# Patient Record
Sex: Male | Born: 1940 | Hispanic: No | Marital: Married | Smoking: Former smoker
Health system: Southern US, Community
[De-identification: ages and names within clinical notes are randomized; demographics above are authoritative.]

## PROBLEM LIST (undated history)

## (undated) DIAGNOSIS — R972 Elevated prostate specific antigen [PSA]: Secondary | ICD-10-CM

## (undated) DIAGNOSIS — H409 Unspecified glaucoma: Secondary | ICD-10-CM

## (undated) DIAGNOSIS — Z9889 Other specified postprocedural states: Secondary | ICD-10-CM

## (undated) DIAGNOSIS — K4090 Unilateral inguinal hernia, without obstruction or gangrene, not specified as recurrent: Secondary | ICD-10-CM

## (undated) DIAGNOSIS — D369 Benign neoplasm, unspecified site: Secondary | ICD-10-CM

## (undated) HISTORY — DX: Elevated prostate specific antigen (PSA): R97.20

## (undated) HISTORY — DX: Benign neoplasm, unspecified site: D36.9

## (undated) HISTORY — DX: Unilateral inguinal hernia, without obstruction or gangrene, not specified as recurrent: K40.90

## (undated) HISTORY — DX: Other specified postprocedural states: Z98.890

## (undated) HISTORY — DX: Unspecified glaucoma: H40.9

## (undated) HISTORY — PX: PROSTATE SURGERY: SHX751

## (undated) HISTORY — PX: VASECTOMY: SHX75

## (undated) HISTORY — PX: TONSILLECTOMY: SUR1361

---

## 1999-07-22 ENCOUNTER — Other Ambulatory Visit: Admission: RE | Admit: 1999-07-22 | Discharge: 1999-07-22 | Payer: Self-pay | Admitting: Urology

## 2000-10-04 ENCOUNTER — Other Ambulatory Visit: Admission: RE | Admit: 2000-10-04 | Discharge: 2000-10-04 | Payer: Self-pay | Admitting: Urology

## 2000-10-04 ENCOUNTER — Encounter (INDEPENDENT_AMBULATORY_CARE_PROVIDER_SITE_OTHER): Payer: Self-pay | Admitting: Specialist

## 2001-06-15 ENCOUNTER — Encounter (INDEPENDENT_AMBULATORY_CARE_PROVIDER_SITE_OTHER): Payer: Self-pay | Admitting: *Deleted

## 2001-06-15 ENCOUNTER — Other Ambulatory Visit: Admission: RE | Admit: 2001-06-15 | Discharge: 2001-06-15 | Payer: Self-pay | Admitting: Urology

## 2003-09-06 HISTORY — PX: EYE SURGERY: SHX253

## 2003-09-19 ENCOUNTER — Ambulatory Visit (HOSPITAL_COMMUNITY): Admission: RE | Admit: 2003-09-19 | Discharge: 2003-09-19 | Payer: Self-pay | Admitting: Gastroenterology

## 2003-09-19 ENCOUNTER — Encounter (INDEPENDENT_AMBULATORY_CARE_PROVIDER_SITE_OTHER): Payer: Self-pay | Admitting: Specialist

## 2007-08-15 ENCOUNTER — Encounter (INDEPENDENT_AMBULATORY_CARE_PROVIDER_SITE_OTHER): Payer: Self-pay | Admitting: Urology

## 2007-08-15 ENCOUNTER — Ambulatory Visit (HOSPITAL_BASED_OUTPATIENT_CLINIC_OR_DEPARTMENT_OTHER): Admission: RE | Admit: 2007-08-15 | Discharge: 2007-08-15 | Payer: Self-pay | Admitting: Urology

## 2007-12-22 ENCOUNTER — Emergency Department (HOSPITAL_COMMUNITY): Admission: EM | Admit: 2007-12-22 | Discharge: 2007-12-22 | Payer: Self-pay | Admitting: Emergency Medicine

## 2011-01-18 NOTE — Op Note (Signed)
NAMEAMIRR, Lance Cruz               ACCOUNT NO.:  [id number]   MEDICAL RECORD NO.:  [id number]          PATIENT TYPE:  AMB   LOCATION:  [facility]                         FACILITY:  [facility]   PHYSICIAN:  Courtney Paris, M.D.DATE OF BIRTH:  1940/12/02   DATE OF PROCEDURE:  08/15/2007  DATE OF DISCHARGE:                               OPERATIVE REPORT   PREOPERATIVE DIAGNOSIS:  Elevated PSA with three negative biopsies.   POSTOPERATIVE DIAGNOSIS:  Elevated PSA with three negative biopsies.   OPERATION:  Transrectal ultrasound of the prostate and saturation  prostate biopsy.   ANESTHESIA:  General.   SURGEON:  Courtney Paris, M.D.   BRIEF HISTORY:  This 70 year old white male was admitted for saturation  biopsy of his prostate.  He has had an elevated PSA now 10.4.  Four  years ago, it was 6.0. He has had three previous biopsies in 2000 and  2002 in February and also on October. He has obstructive uropathy under  conservative management and enters now for repeat biopsy of the prostate  under anesthesia since the last was so painful.   He had been on antibiotics prior to the procedure, he was given Rocephin  IV and underwent successful induction of general anesthesia. He was  placed on his left lateral side after endotracheal intubation.  The 7.5  MHz prostate probe was inserted and the prostate scanned. The prostate  measured 44 grams in size.  It had some scattered calcifications typical  of BPH but no definite hypoechoic areas. The right seminal vesicle was  slightly larger than the left.  There was a vague hypoechoic area on the  left base.  I then took two biopsies of the left base and three of the  right base, two each of the left and right mid and then three each of  the left and right apex. These were sent in six separate containers  describing the area where the prostate tissue was assembled.  He had a  little bit of urethral and rectal bleeding that seemed to stop.  He  was  taken to the recovery room in good condition to be later discharged as  an outpatient. He will continue antibiotics for 5 days postop and be  called regarding his biopsy report when available.      Courtney Paris, M.D.  Electronically Signed     HMK/MEDQ  D:  08/15/2007  T:  08/15/2007  Job:  [id number]

## 2011-01-21 NOTE — Op Note (Signed)
NAME:  Lance Cruz, Lance Cruz                         ACCOUNT NO.:  [id number]   MEDICAL RECORD NO.:  [id number]                   PATIENT TYPE:  AMB   LOCATION:  ENDO                                 FACILITY:  [facility]   PHYSICIAN:  John C. Madilyn Fireman, M.D.                 DATE OF BIRTH:  29-Dec-1940   DATE OF PROCEDURE:  09/19/2003  DATE OF DISCHARGE:                                 OPERATIVE REPORT   PROCEDURE:  Colonoscopy.   INDICATIONS FOR PROCEDURE:  Average risk colon cancer screening.   DESCRIPTION OF PROCEDURE:  The patient was placed in the left lateral  decubitus position and placed on the pulse monitor with continuous low-flow  oxygen delivered by nasal cannula.  He was sedated with 75 mcg IV fentanyl  and 8 mg IV Versed.  The Olympus video colonoscope was inserted into the  rectum and advanced to the cecum, confirmed by transillumination of  McBurney's point and visualization of the ileocecal valve and appendiceal  orifice.  Prep was excellent.  The cecum, ascending, transverse, and  descending colon all appeared normal with no masses, polyps, diverticula, or  other mucosal abnormalities.  Within the sigmoid colon, there was an 8 mm  polyp that was fulgurated by hot biopsy.  In the rectum, there was a similar  polyp, 6 mm in diameter that was also fulgurated by hot biopsy.  No other  abnormalities were seen in the sigmoid and rectum.  The colonoscope was then  withdrawn and the patient returned to the recovery room in stable condition.  He tolerated the procedure well and there were no immediate complications.   IMPRESSION:  Sigmoid and rectal polyps.   PLAN:  Will await histology to determine method and interval for future  colon screening.                                               John C. Madilyn Fireman, M.D.    JCH/MEDQ  D:  09/19/2003  T:  09/19/2003  Job:  [id number]   cc:   Caryn Bee L. Little, M.D.  [address]  [place]  [place] [address]  Fax: [phone]

## 2012-10-11 ENCOUNTER — Encounter (INDEPENDENT_AMBULATORY_CARE_PROVIDER_SITE_OTHER): Payer: Self-pay | Admitting: Surgery

## 2012-10-15 ENCOUNTER — Ambulatory Visit (INDEPENDENT_AMBULATORY_CARE_PROVIDER_SITE_OTHER): Payer: 59 | Admitting: Surgery

## 2012-10-17 ENCOUNTER — Ambulatory Visit (INDEPENDENT_AMBULATORY_CARE_PROVIDER_SITE_OTHER): Payer: 59 | Admitting: Surgery

## 2012-10-17 ENCOUNTER — Encounter (INDEPENDENT_AMBULATORY_CARE_PROVIDER_SITE_OTHER): Payer: Self-pay | Admitting: Surgery

## 2012-10-17 VITALS — BP 124/76 | HR 80 | Temp 97.1°F | Resp 16 | Ht 67.5 in | Wt 153.8 lb

## 2012-10-17 DIAGNOSIS — K4020 Bilateral inguinal hernia, without obstruction or gangrene, not specified as recurrent: Secondary | ICD-10-CM | POA: Insufficient documentation

## 2012-10-17 DIAGNOSIS — K4090 Unilateral inguinal hernia, without obstruction or gangrene, not specified as recurrent: Secondary | ICD-10-CM

## 2012-10-17 HISTORY — DX: Unilateral inguinal hernia, without obstruction or gangrene, not specified as recurrent: K40.90

## 2012-10-17 NOTE — Patient Instructions (Signed)
See the Handout(s) we gave you.  Consider surgery.  Please call our office at [phone] if you wish to schedule surgery or if you have further questions / concerns.   Hernia A hernia occurs when an internal organ pushes out through a weak spot in the abdominal wall. Hernias most commonly occur in the groin and around the navel. Hernias often can be pushed back into place (reduced). Most hernias tend to get worse over time. Some abdominal hernias can get stuck in the opening (irreducible or incarcerated hernia) and cannot be reduced. An irreducible abdominal hernia which is tightly squeezed into the opening is at risk for impaired blood supply (strangulated hernia). A strangulated hernia is a medical emergency. Because of the risk for an irreducible or strangulated hernia, surgery may be recommended to repair a hernia. CAUSES   Heavy lifting.  Prolonged coughing.  Straining to have a bowel movement.  A cut (incision) made during an abdominal surgery. HOME CARE INSTRUCTIONS   Bed rest is not required. You may continue your normal activities.  Avoid lifting more than 10 pounds (4.5 kg) or straining.  Cough gently. If you are a smoker it is best to stop. Even the best hernia repair can break down with the continual strain of coughing. Even if you do not have your hernia repaired, a cough will continue to aggravate the problem.  Do not wear anything tight over your hernia. Do not try to keep it in with an outside bandage or truss. These can damage abdominal contents if they are trapped within the hernia sac.  Eat a normal diet.  Avoid constipation. Straining over long periods of time will increase hernia size and encourage breakdown of repairs. If you cannot do this with diet alone, stool softeners may be used. SEEK IMMEDIATE MEDICAL CARE IF:   You have a fever.  You develop increasing abdominal pain.  You feel nauseous or vomit.  Your hernia is stuck outside the abdomen, looks  discolored, feels hard, or is tender.  You have any changes in your bowel habits or in the hernia that are unusual for you.  You have increased pain or swelling around the hernia.  You cannot push the hernia back in place by applying gentle pressure while lying down. MAKE SURE YOU:   Understand these instructions.  Will watch your condition.  Will get help right away if you are not doing well or get worse. Document Released: 08/22/2005 Document Revised: 11/14/2011 Document Reviewed: 04/10/2008 [facility] Patient Information 2013 [place], [place].  LAPAROSCOPIC SURGERY: POST OP INSTRUCTIONS  1. DIET: Follow a light bland diet the first 24 hours after arrival home, such as soup, liquids, crackers, etc.  Be sure to include lots of fluids daily.  Avoid fast food or heavy meals as your are more likely to get nauseated.  Eat a low fat the next few days after surgery.   2. Take your usually prescribed home medications unless otherwise directed. 3. PAIN CONTROL: a. Pain is best controlled by a usual combination of three different methods TOGETHER: i. Ice/Heat ii. Over the counter pain medication iii. Prescription pain medication b. Most patients will experience some swelling and bruising around the incisions.  Ice packs or heating pads (30-60 minutes up to 6 times a day) will help. Use ice for the first few days to help decrease swelling and bruising, then switch to heat to help relax tight/sore spots and speed recovery.  Some people prefer to use ice alone, heat alone, alternating  between ice & heat.  Experiment to what works for you.  Swelling and bruising can take several weeks to resolve.   c. It is helpful to take an over-the-counter pain medication regularly for the first few weeks.  Choose one of the following that works best for you: i. Naproxen (Aleve, etc)  Two 220mg  tabs twice a day ii. Ibuprofen (Advil, etc) Three 200mg  tabs four times a day (every meal & bedtime) iii. Acetaminophen  (Tylenol, etc) 500-650mg  four times a day (every meal & bedtime) d. A  prescription for pain medication (such as oxycodone, hydrocodone, etc) should be given to you upon discharge.  Take your pain medication as prescribed.  i. If you are having problems/concerns with the prescription medicine (does not control pain, nausea, vomiting, rash, itching, etc), please call us [phone] to see if we need to switch you to a different pain medicine that will work better for you and/or control your side effect better. ii. If you need a refill on your pain medication, please contact your pharmacy.  They will contact our office to request authorization. Prescriptions will not be filled after 5 pm or on week-ends. 4. Avoid getting constipated.  Between the surgery and the pain medications, it is common to experience some constipation.  Increasing fluid intake and taking a fiber supplement (such as Metamucil, Citrucel, FiberCon, MiraLax, etc) 1-2 times a day regularly will usually help prevent this problem from occurring.  A mild laxative (prune juice, Milk of Magnesia, MiraLax, etc) should be taken according to package directions if there are no bowel movements after 48 hours.   5. Watch out for diarrhea.  If you have many loose bowel movements, simplify your diet to bland foods & liquids for a few days.  Stop any stool softeners and decrease your fiber supplement.  Switching to mild anti-diarrheal medications (Kayopectate, Pepto Bismol) can help.  If this worsens or does not improve, please call us. 6. Wash / shower every day.  You may shower over the dressings as they are waterproof.  Continue to shower over incision(s) after the dressing is off. 7. Remove your waterproof bandages 5 days after surgery.  You may leave the incision open to air.  You may replace a dressing/Band-Aid to cover the incision for comfort if you wish.  8. ACTIVITIES as tolerated:   a. You may resume regular (light) daily activities  beginning the next day-such as daily self-care, walking, climbing stairs-gradually increasing activities as tolerated.  If you can walk 30 minutes without difficulty, it is safe to try more intense activity such as jogging, treadmill, bicycling, low-impact aerobics, swimming, etc. b. Save the most intensive and strenuous activity for last such as sit-ups, heavy lifting, contact sports, etc  Refrain from any heavy lifting or straining until you are off narcotics for pain control.   c. DO NOT PUSH THROUGH PAIN.  Let pain be your guide: If it hurts to do something, don't do it.  Pain is your body warning you to avoid that activity for another week until the pain goes down. d. You may drive when you are no longer taking prescription pain medication, you can comfortably wear a seatbelt, and you can safely maneuver your car and apply brakes. e. Bonita Quin may have sexual intercourse when it is comfortable.  9. FOLLOW UP in our office a. Please call [facility] at [phone] to set up an appointment to see your surgeon in the office for a follow-up appointment approximately 2-3 weeks after  your surgery. b. Make sure that you call for this appointment the day you arrive home to insure a convenient appointment time. 10. IF YOU HAVE DISABILITY OR FAMILY LEAVE FORMS, BRING THEM TO THE OFFICE FOR PROCESSING.  DO NOT GIVE THEM TO YOUR DOCTOR.   WHEN TO CALL us [phone]: 1. Poor pain control 2. Reactions / problems with new medications (rash/itching, nausea, etc)  3. Fever over 101.5 F (38.5 C) 4. Inability to urinate 5. Nausea and/or vomiting 6. Worsening swelling or bruising 7. Continued bleeding from incision. 8. Increased pain, redness, or drainage from the incision   The clinic staff is available to answer your questions during regular business hours (8:30am-5pm).  Please don't hesitate to call and ask to speak to one of our nurses for clinical concerns.   If you have a medical emergency, go to the nearest  emergency room or call 911.  A surgeon from [facility] is always on call at the [facility], [place] [address], [address], [place], [place]  [address] ? MAIN: [phone] ? TOLL FREE: [phone] ?  FAX [phone] [email or web address]

## 2012-10-17 NOTE — Progress Notes (Signed)
Subjective:     Patient ID: Lance Cruz, male   DOB: 1940/11/03, 72 y.o.   MRN: [id number]  HPI  LIEUTENANT ABARCA  1941-07-20 [id number]  Patient Care Team: Catha Gosselin, MD as PCP - General (Family Medicine)  This patient is a 72 y.o.male who presents today for surgical evaluation at the request of Dr. Clarene Duke.   Reason for visit: RIH  Pleasant active male.  Claims he can walk to [employer] without any problems.  Works at [employer]. Regularly.  He lives in [place] but works up in [place].  Noted some right groin bulging and discomfort about two weeks ago after removing.  Is sensitive.  Worse with activity.  Has some sensitivity that goes to the left side as well.  Has daily bowel movements.  No prior surgeries.  No history of skin infections.  Rather physically active.  No episodes of severe incarceration or strangulation.  Parents both alive and active.  Patient Active Problem List  Diagnosis  . Inguinal hernia, right  . Inguinal sensitivity, ? left inguinal hernia    Past Medical History  Diagnosis Date  . Glaucoma   . Tubular adenoma   . PSA elevation   . H/O colonoscopy 09/19/2003 & 04/30/2010    Past Surgical History  Procedure Laterality Date  . Eye surgery  2005    left eye  . Prostate surgery    . Tonsillectomy    . Vasectomy      History   Social History  . Marital Status: Married    Spouse Name: N/A    Number of Children: N/A  . Years of Education: N/A   Occupational History  . Not on file.   Social History Main Topics  . Smoking status: Former Smoker    Quit date: 10/12/1963  . Smokeless tobacco: Not on file  . Alcohol Use: Yes     Comment: 2-3  . Drug Use: No  . Sexually Active: Not on file   Other Topics Concern  . Not on file   Social History Narrative  . No narrative on file    History reviewed. No pertinent family history.  Current Outpatient Prescriptions  Medication Sig Dispense Refill  . aspirin 81 MG tablet Take 81 mg by  mouth daily.      Marland Kitchen glucosamine-chondroitin 500-400 MG tablet Take 1 tablet by mouth 3 (three) times daily.      . Magnesium 250 MG TABS Take by mouth.      . Multiple Vitamin (MULTIVITAMIN) capsule Take 1 capsule by mouth daily.      . Travoprost, BAK Free, (TRAVATAN) 0.004 % SOLN ophthalmic solution 1 drop at bedtime.       No current facility-administered medications for this visit.     No Known Allergies  BP 124/76  Pulse 80  Temp(Src) 97.1 F (36.2 C) (Temporal)  Resp 16  Ht 5' 7.5" (1.715 m)  Wt 153 lb 12.8 oz (69.763 kg)  BMI 23.72 kg/m2  No results found.   Review of Systems  Constitutional: Negative for fever, chills and diaphoresis.  HENT: Negative for nosebleeds, sore throat, facial swelling, mouth sores, trouble swallowing and ear discharge.   Eyes: Negative for photophobia, discharge and visual disturbance.  Respiratory: Negative for choking, chest tightness, shortness of breath and stridor.   Cardiovascular: Negative for chest pain and palpitations.  Gastrointestinal: Negative for nausea, vomiting, diarrhea, constipation, blood in stool, abdominal distention, anal bleeding and rectal pain.  Genitourinary: Negative for dysuria,  urgency, difficulty urinating and testicular pain.  Musculoskeletal: Negative for myalgias, back pain, arthralgias and gait problem.  Skin: Negative for color change, pallor, rash and wound.  Neurological: Negative for dizziness, speech difficulty, weakness, numbness and headaches.  Hematological: Negative for adenopathy. Does not bruise/bleed easily.  Psychiatric/Behavioral: Negative for hallucinations, confusion and agitation.       Objective:   Physical Exam  Constitutional: He is oriented to person, place, and time. He appears well-developed and well-nourished. No distress.  HENT:  Head: Normocephalic.  Mouth/Throat: Oropharynx is clear and moist. No oropharyngeal exudate.  Eyes: Conjunctivae and EOM are normal. Pupils are equal,  round, and reactive to light. No scleral icterus.  Neck: Normal range of motion. Neck supple. No tracheal deviation present.  Cardiovascular: Normal rate, regular rhythm and intact distal pulses.   Pulmonary/Chest: Effort normal and breath sounds normal. No respiratory distress.  Abdominal: Soft. He exhibits no distension. There is no rigidity, no guarding, no CVA tenderness, no tenderness at McBurney's point and negative Murphy's sign. A hernia is present. Hernia confirmed positive in the right inguinal area. Hernia confirmed negative in the ventral area.    Musculoskeletal: Normal range of motion. He exhibits no tenderness.  Lymphadenopathy:    He has no cervical adenopathy.       Right: No inguinal adenopathy present.       Left: No inguinal adenopathy present.  Neurological: He is alert and oriented to person, place, and time. No cranial nerve deficit. He exhibits normal muscle tone. Coordination normal.  Skin: Skin is warm and dry. No rash noted. He is not diaphoretic. No erythema. No pallor.  Psychiatric: He has a normal mood and affect. His behavior is normal. Judgment and thought content normal.       Assessment:     Obvious right inguinal hernia.  Sensitivity and bulge on Valsalva suspicious for left inguinal hernia as well     Plan:     I think he would benefit from surgery to repair his hernia on the right.  Also look on the left side to make sure one is not there as well.  He would like side repairing both sides if there is any evidence that he is sometimes sensitive on the left side as well.  I discussed the procedure with him:  The anatomy & physiology of the abdominal wall and pelvic floor was discussed.  The pathophysiology of hernias in the inguinal and pelvic region was discussed.  Natural history risks such as progressive enlargement, pain, incarceration & strangulation was discussed.   Contributors to complications such as smoking, obesity, diabetes, prior surgery, etc  were discussed.    I feel the risks of no intervention will lead to serious problems that outweigh the operative risks; therefore, I recommended surgery to reduce and repair the hernia.  I explained laparoscopic techniques with possible need for an open approach.  I noted usual use of mesh to patch and/or buttress hernia repair  Risks such as bleeding, infection, abscess, need for further treatment, heart attack, death, and other risks were discussed.  I noted a good likelihood this will help address the problem.   Goals of post-operative recovery were discussed as well.  Possibility that this will not correct all symptoms was explained.  I stressed the importance of low-impact activity, aggressive pain control, avoiding constipation, & not pushing through pain to minimize risk of post-operative chronic pain or injury. Possibility of reherniation was discussed.  We will work to minimize complications.  An educational handout further explaining the pathology & treatment options was given as well.  Questions were answered.  The patient expresses understanding & wishes to proceed with surgery.

## 2012-11-06 DIAGNOSIS — K4020 Bilateral inguinal hernia, without obstruction or gangrene, not specified as recurrent: Secondary | ICD-10-CM

## 2012-11-06 HISTORY — PX: OTHER SURGICAL HISTORY: SHX169

## 2012-11-07 ENCOUNTER — Encounter (INDEPENDENT_AMBULATORY_CARE_PROVIDER_SITE_OTHER): Payer: 59 | Admitting: Surgery

## 2012-11-08 ENCOUNTER — Telehealth (INDEPENDENT_AMBULATORY_CARE_PROVIDER_SITE_OTHER): Payer: Self-pay | Admitting: General Surgery

## 2012-11-08 NOTE — Telephone Encounter (Signed)
Pt called to ask about the swelling and bruising on his scrotum and penis.  Discussed elevation of these when his is seated to promote reabsorption of the excess fluid.  He is passing gas today, but has not yet had a BM.  Encouraged pt to walk more, increase po fluids, use stool softeners and try fiber supplements.  He appreciated all suggestion and reassurances.  Will call back prn.

## 2012-11-21 ENCOUNTER — Encounter (INDEPENDENT_AMBULATORY_CARE_PROVIDER_SITE_OTHER): Payer: Self-pay | Admitting: Surgery

## 2012-11-21 ENCOUNTER — Ambulatory Visit (INDEPENDENT_AMBULATORY_CARE_PROVIDER_SITE_OTHER): Payer: 59 | Admitting: Surgery

## 2012-11-21 VITALS — BP 142/78 | HR 70 | Resp 18 | Ht 67.5 in | Wt 156.0 lb

## 2012-11-21 DIAGNOSIS — L270 Generalized skin eruption due to drugs and medicaments taken internally: Secondary | ICD-10-CM | POA: Insufficient documentation

## 2012-11-21 DIAGNOSIS — K4020 Bilateral inguinal hernia, without obstruction or gangrene, not specified as recurrent: Secondary | ICD-10-CM

## 2012-11-21 MED ORDER — HYDROCORTISONE 2.5 % EX LOTN: TOPICAL_LOTION | CUTANEOUS | Status: AC

## 2012-11-21 NOTE — Progress Notes (Signed)
Subjective:     Patient ID: Lance Cruz, male   DOB: [birth year], 72 y.o.   MRN: [id number]  HPI  Lance Cruz  04/30/1941 [id number]  Patient Care Team: Catha Gosselin, MD as PCP - General (Family Medicine)  This patient is a 72 y.o.male who presents today for surgical evaluation Status post laparoscopic bilateral inguinal hernia repairs 11/06/2012  The patient comes in today feeling relatively well.  Had some constipation but better.  Has been living up in [place].  Planning to travel back to [place] later this week.  Some soreness with increased activity but not too severe.  Did not need narcotics for too long.  Overall, in good spirits  Patient Active Problem List  Diagnosis  . Rash, tegaderm dressing - contact    Past Medical History  Diagnosis Date  . Glaucoma   . Tubular adenoma   . PSA elevation   . H/O colonoscopy 09/19/2003 & 04/30/2010  . Bilateral inguinal hernia (BIH) s/p lap BIH repair 11/06/2012 10/17/2012    Past Surgical History  Procedure Laterality Date  . Eye surgery  2005    left eye  . Prostate surgery    . Tonsillectomy    . Vasectomy    . Bilateral lap ing.hernia repair  11/06/2012    History   Social History  . Marital Status: Married    Spouse Name: N/A    Number of Children: N/A  . Years of Education: N/A   Occupational History  . Not on file.   Social History Main Topics  . Smoking status: Former Smoker    Quit date: 10/12/1963  . Smokeless tobacco: Not on file  . Alcohol Use: Yes     Comment: 2-3  . Drug Use: No  . Sexually Active: Not on file   Other Topics Concern  . Not on file   Social History Narrative  . No narrative on file    History reviewed. No pertinent family history.  Current Outpatient Prescriptions  Medication Sig Dispense Refill  . aspirin 81 MG tablet Take 81 mg by mouth daily.      Marland Kitchen glucosamine-chondroitin 500-400 MG tablet Take 1 tablet by mouth 3 (three) times daily.      . Magnesium 250 MG  TABS Take by mouth.      . Multiple Vitamin (MULTIVITAMIN) capsule Take 1 capsule by mouth daily.      . Travoprost, BAK Free, (TRAVATAN) 0.004 % SOLN ophthalmic solution 1 drop at bedtime.      . hydrocortisone 2.5 % lotion Apply to rash on abdomen 2 times daily until rash gone (5-7 days)  60 mL  1   No current facility-administered medications for this visit.     Allergies  Allergen Reactions  . Tape Rash    Patient had topical skin rash to Tegaderm clear dressings.  Some itching     BP 142/78  Pulse 70  Resp 18  Ht 5' 7.5" (1.715 m)  Wt 156 lb (70.761 kg)  BMI 24.06 kg/m2  No results found.   Review of Systems  Constitutional: Negative for fever, chills and diaphoresis.  HENT: Negative for sore throat, trouble swallowing and neck pain.   Eyes: Negative for photophobia and visual disturbance.  Respiratory: Negative for choking and shortness of breath.   Cardiovascular: Negative for chest pain and palpitations.  Gastrointestinal: Negative for nausea, vomiting, diarrhea, constipation, abdominal distention, anal bleeding and rectal pain.  Genitourinary: Negative for dysuria, urgency, decreased urine volume, penile  swelling, scrotal swelling, difficulty urinating and penile pain.  Musculoskeletal: Negative for myalgias, arthralgias and gait problem.  Skin: Positive for rash. Negative for color change.  Neurological: Negative for dizziness, speech difficulty, weakness and numbness.  Hematological: Negative for adenopathy.  Psychiatric/Behavioral: Negative for hallucinations, confusion and agitation.       Objective:   Physical Exam  Constitutional: He is oriented to person, place, and time. He appears well-developed and well-nourished. No distress.  HENT:  Head: Normocephalic.  Mouth/Throat: Oropharynx is clear and moist. No oropharyngeal exudate.  Eyes: Conjunctivae and EOM are normal. Pupils are equal, round, and reactive to light. No scleral icterus.  Neck: Normal range  of motion. No tracheal deviation present.  Cardiovascular: Normal rate, normal heart sounds and intact distal pulses.   Pulmonary/Chest: Effort normal. No respiratory distress.  Abdominal: Soft. He exhibits no distension. There is no tenderness. Hernia confirmed negative in the right inguinal area and confirmed negative in the left inguinal area.  Incisions clean with normal healing ridges.  No hernias  Musculoskeletal: Normal range of motion. He exhibits no tenderness.  Neurological: He is alert and oriented to person, place, and time. No cranial nerve deficit. He exhibits normal muscle tone. Coordination normal.  Skin: Skin is warm and dry. Rash noted. No purpura noted. Rash is maculopapular. Rash is not nodular. He is not diaphoretic. No cyanosis. Nails show no clubbing.     Psychiatric: He has a normal mood and affect. His behavior is normal.       Assessment:     Two weeks status post laparoscopic bilateral inguinal hernia repairs.  Recovering relatively well.  Rash is suspicious for contact allergy to the Tegaderm dressings.     Plan:     Increase activity as tolerated to regular activity.  Do not push through pain.  Hydrocortisone 2.5% cream to rash to help it calm down.  Diet as tolerated. Bowel regimen to avoid problems.  Return to clinic p.r.n.   Instructions discussed.  Followup with primary care physician for other health issues as would normally be done.  Questions answered.  The patient expressed understanding and appreciation

## 2012-11-21 NOTE — Patient Instructions (Signed)
Rash A rash is a change in the color or texture of your skin. There are many different types of rashes. You may have other problems that accompany your rash. CAUSES   Infections.  Allergic reactions. This can include allergies to pets or foods.  Certain medicines.  Exposure to certain chemicals, soaps, or cosmetics.  Heat.  Exposure to poisonous plants.  Tumors, both cancerous and noncancerous. SYMPTOMS   Redness.  Scaly skin.  Itchy skin.  Dry or cracked skin.  Bumps.  Blisters.  Pain. DIAGNOSIS  Your caregiver may do a physical exam to determine what type of rash you have. A skin sample (biopsy) may be taken and examined under a microscope. TREATMENT  Treatment depends on the type of rash you have. Your caregiver may prescribe certain medicines. For serious conditions, you may need to see a skin doctor (dermatologist). HOME CARE INSTRUCTIONS   Avoid the substance that caused your rash.  Do not scratch your rash. This can cause infection.  You may take cool baths to help stop itching.  Only take over-the-counter or prescription medicines as directed by your caregiver.  Keep all follow-up appointments as directed by your caregiver. SEEK IMMEDIATE MEDICAL CARE IF:  You have increasing pain, swelling, or redness.  You have a fever.  You have new or severe symptoms.  You have body aches, diarrhea, or vomiting.  Your rash is not better after 3 days. MAKE SURE YOU:  Understand these instructions.  Will watch your condition.  Will get help right away if you are not doing well or get worse. Document Released: 08/12/2002 Document Revised: 11/14/2011 Document Reviewed: 06/06/2011 [facility] Patient Information 2013 [place], [place].  HERNIA REPAIR: POST OP INSTRUCTIONS  1. DIET: Follow a light bland diet the first 24 hours after arrival home, such as soup, liquids, crackers, etc.  Be sure to include lots of fluids daily.  Avoid fast food or heavy meals as  your are more likely to get nauseated.  Eat a low fat the next few days after surgery. 2. Take your usually prescribed home medications unless otherwise directed. 3. PAIN CONTROL: a. Pain is best controlled by a usual combination of three different methods TOGETHER: i. Ice/Heat ii. Over the counter pain medication iii. Prescription pain medication b. Most patients will experience some swelling and bruising around the hernia(s) such as the bellybutton, groins, or old incisions.  Ice packs or heating pads (30-60 minutes up to 6 times a day) will help. Use ice for the first few days to help decrease swelling and bruising, then switch to heat to help relax tight/sore spots and speed recovery.  Some people prefer to use ice alone, heat alone, alternating between ice & heat.  Experiment to what works for you.  Swelling and bruising can take several weeks to resolve.   c. It is helpful to take an over-the-counter pain medication regularly for the first few weeks.  Choose one of the following that works best for you: i. Naproxen (Aleve, etc)  Two 220mg  tabs twice a day ii. Ibuprofen (Advil, etc) Three 200mg  tabs four times a day (every meal & bedtime) iii. Acetaminophen (Tylenol, etc) 325-650mg  four times a day (every meal & bedtime) d. A  prescription for pain medication should be given to you upon discharge.  Take your pain medication as prescribed.  i. If you are having problems/concerns with the prescription medicine (does not control pain, nausea, vomiting, rash, itching, etc), please call us [phone] to see if we need  to switch you to a different pain medicine that will work better for you and/or control your side effect better. ii. If you need a refill on your pain medication, please contact your pharmacy.  They will contact our office to request authorization. Prescriptions will not be filled after 5 pm or on week-ends. 4. Avoid getting constipated.  Between the surgery and the pain medications,  it is common to experience some constipation.  Increasing fluid intake and taking a fiber supplement (such as Metamucil, Citrucel, FiberCon, MiraLax, etc) 1-2 times a day regularly will usually help prevent this problem from occurring.  A mild laxative (prune juice, Milk of Magnesia, MiraLax, etc) should be taken according to package directions if there are no bowel movements after 48 hours.   5. Wash / shower every day.  You may shower over the dressings as they are waterproof.   6. Remove your waterproof bandages 5 days after surgery.  You may leave the incision open to air.  You may replace a dressing/Band-Aid to cover the incision for comfort if you wish.  Continue to shower over incision(s) after the dressing is off.    7. ACTIVITIES as tolerated:   a. You may resume regular (light) daily activities beginning the next day-such as daily self-care, walking, climbing stairs-gradually increasing activities as tolerated.  If you can walk 30 minutes without difficulty, it is safe to try more intense activity such as jogging, treadmill, bicycling, low-impact aerobics, swimming, etc. b. Save the most intensive and strenuous activity for last such as sit-ups, heavy lifting, contact sports, etc  Refrain from any heavy lifting or straining until you are off narcotics for pain control.   c. DO NOT PUSH THROUGH PAIN.  Let pain be your guide: If it hurts to do something, don't do it.  Pain is your body warning you to avoid that activity for another week until the pain goes down. d. You may drive when you are no longer taking prescription pain medication, you can comfortably wear a seatbelt, and you can safely maneuver your car and apply brakes. e. Bonita Quin may have sexual intercourse when it is comfortable.  8. FOLLOW UP in our office a. Please call [facility] at [phone] to set up an appointment to see your surgeon in the office for a follow-up appointment approximately 2-3 weeks after your surgery. b. Make sure  that you call for this appointment the day you arrive home to insure a convenient appointment time. 9.  IF YOU HAVE DISABILITY OR FAMILY LEAVE FORMS, BRING THEM TO THE OFFICE FOR PROCESSING.  DO NOT GIVE THEM TO YOUR DOCTOR.  WHEN TO CALL us [phone]: 1. Poor pain control 2. Reactions / problems with new medications (rash/itching, nausea, etc)  3. Fever over 101.5 F (38.5 C) 4. Inability to urinate 5. Nausea and/or vomiting 6. Worsening swelling or bruising 7. Continued bleeding from incision. 8. Increased pain, redness, or drainage from the incision   The clinic staff is available to answer your questions during regular business hours (8:30am-5pm).  Please don't hesitate to call and ask to speak to one of our nurses for clinical concerns.   If you have a medical emergency, go to the nearest emergency room or call 911.  A surgeon from [facility] is always on call at the hospitals in [facility], [place] [address], [place], [place]  [address] ?  [address] [address], [place], [place]   [address] MAIN: [phone] ?  TOLL FREE: [phone] ? FAX: [phone] [email or web address]

## 2012-12-25 ENCOUNTER — Telehealth (INDEPENDENT_AMBULATORY_CARE_PROVIDER_SITE_OTHER): Payer: Self-pay | Admitting: General Surgery

## 2012-12-25 NOTE — Telephone Encounter (Signed)
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  Discussion reveals he is back to full activity and feels well.  Informed it will be ok from surgical standpoint for him to try.

## 2013-05-22 ENCOUNTER — Other Ambulatory Visit: Payer: Self-pay | Admitting: Dermatology

## 2013-08-14 ENCOUNTER — Other Ambulatory Visit: Payer: Self-pay | Admitting: Dermatology

## 2014-05-13 ENCOUNTER — Ambulatory Visit
Admission: RE | Admit: 2014-05-13 | Discharge: 2014-05-13 | Disposition: A | Discharge status: Home / Self Care | Payer: 59 | Source: Ambulatory Visit | Attending: Family Medicine | Admitting: Family Medicine

## 2014-05-13 ENCOUNTER — Other Ambulatory Visit: Payer: Self-pay | Admitting: Family Medicine

## 2014-05-13 DIAGNOSIS — R05 Cough: Secondary | ICD-10-CM

## 2014-05-13 DIAGNOSIS — R059 Cough, unspecified: Secondary | ICD-10-CM

## 2014-05-13 DIAGNOSIS — R0989 Other specified symptoms and signs involving the circulatory and respiratory systems: Secondary | ICD-10-CM

## 2015-01-01 IMAGING — CR DG CHEST 2V
2 series · 2 of 2 positions shown · non-contrast
Comparison: 12/27/2010 and 07/15/1996.

CLINICAL DATA: Cough and congestion for 2 weeks.

EXAM:
CHEST  2 VIEW

[w chest pa]
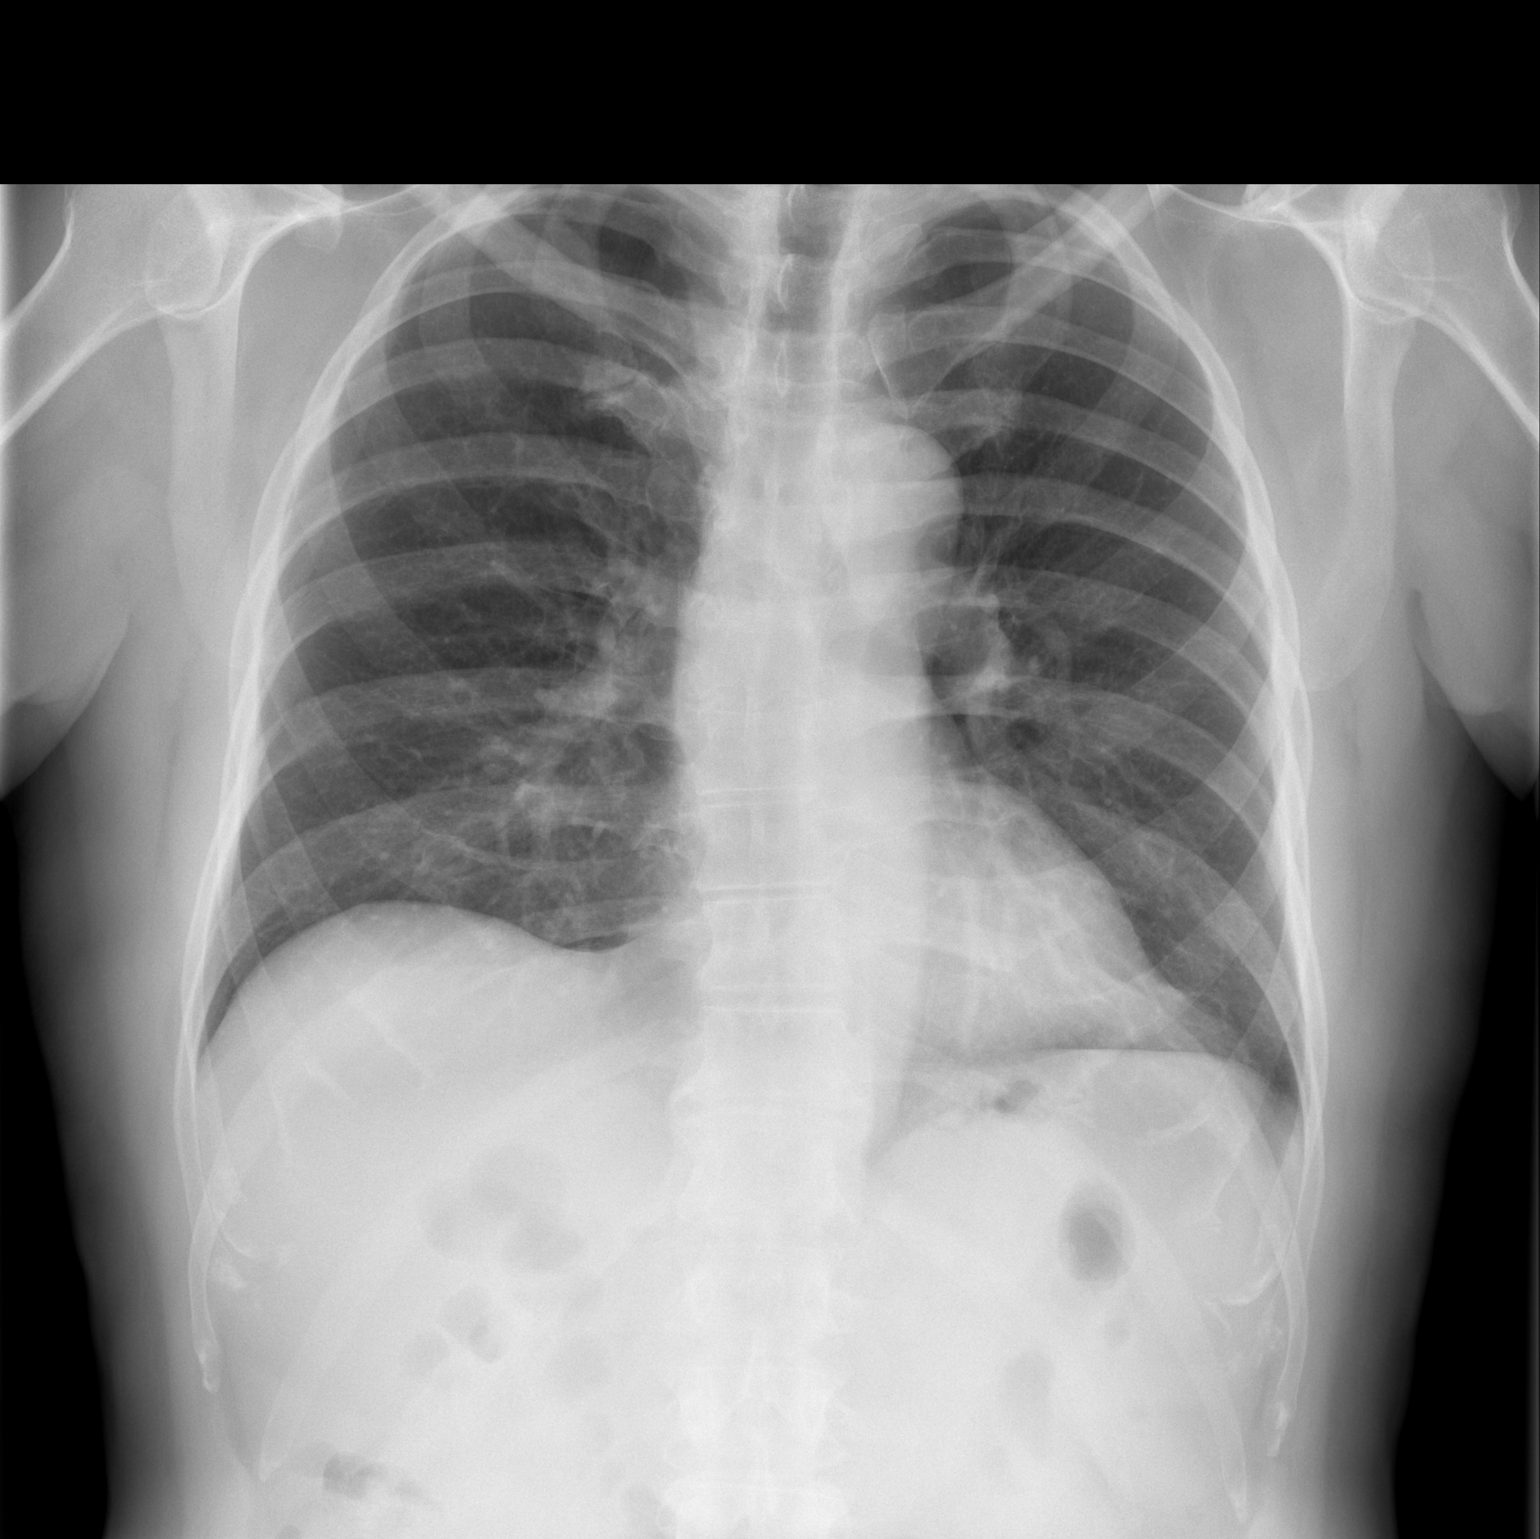

[w chest lat]
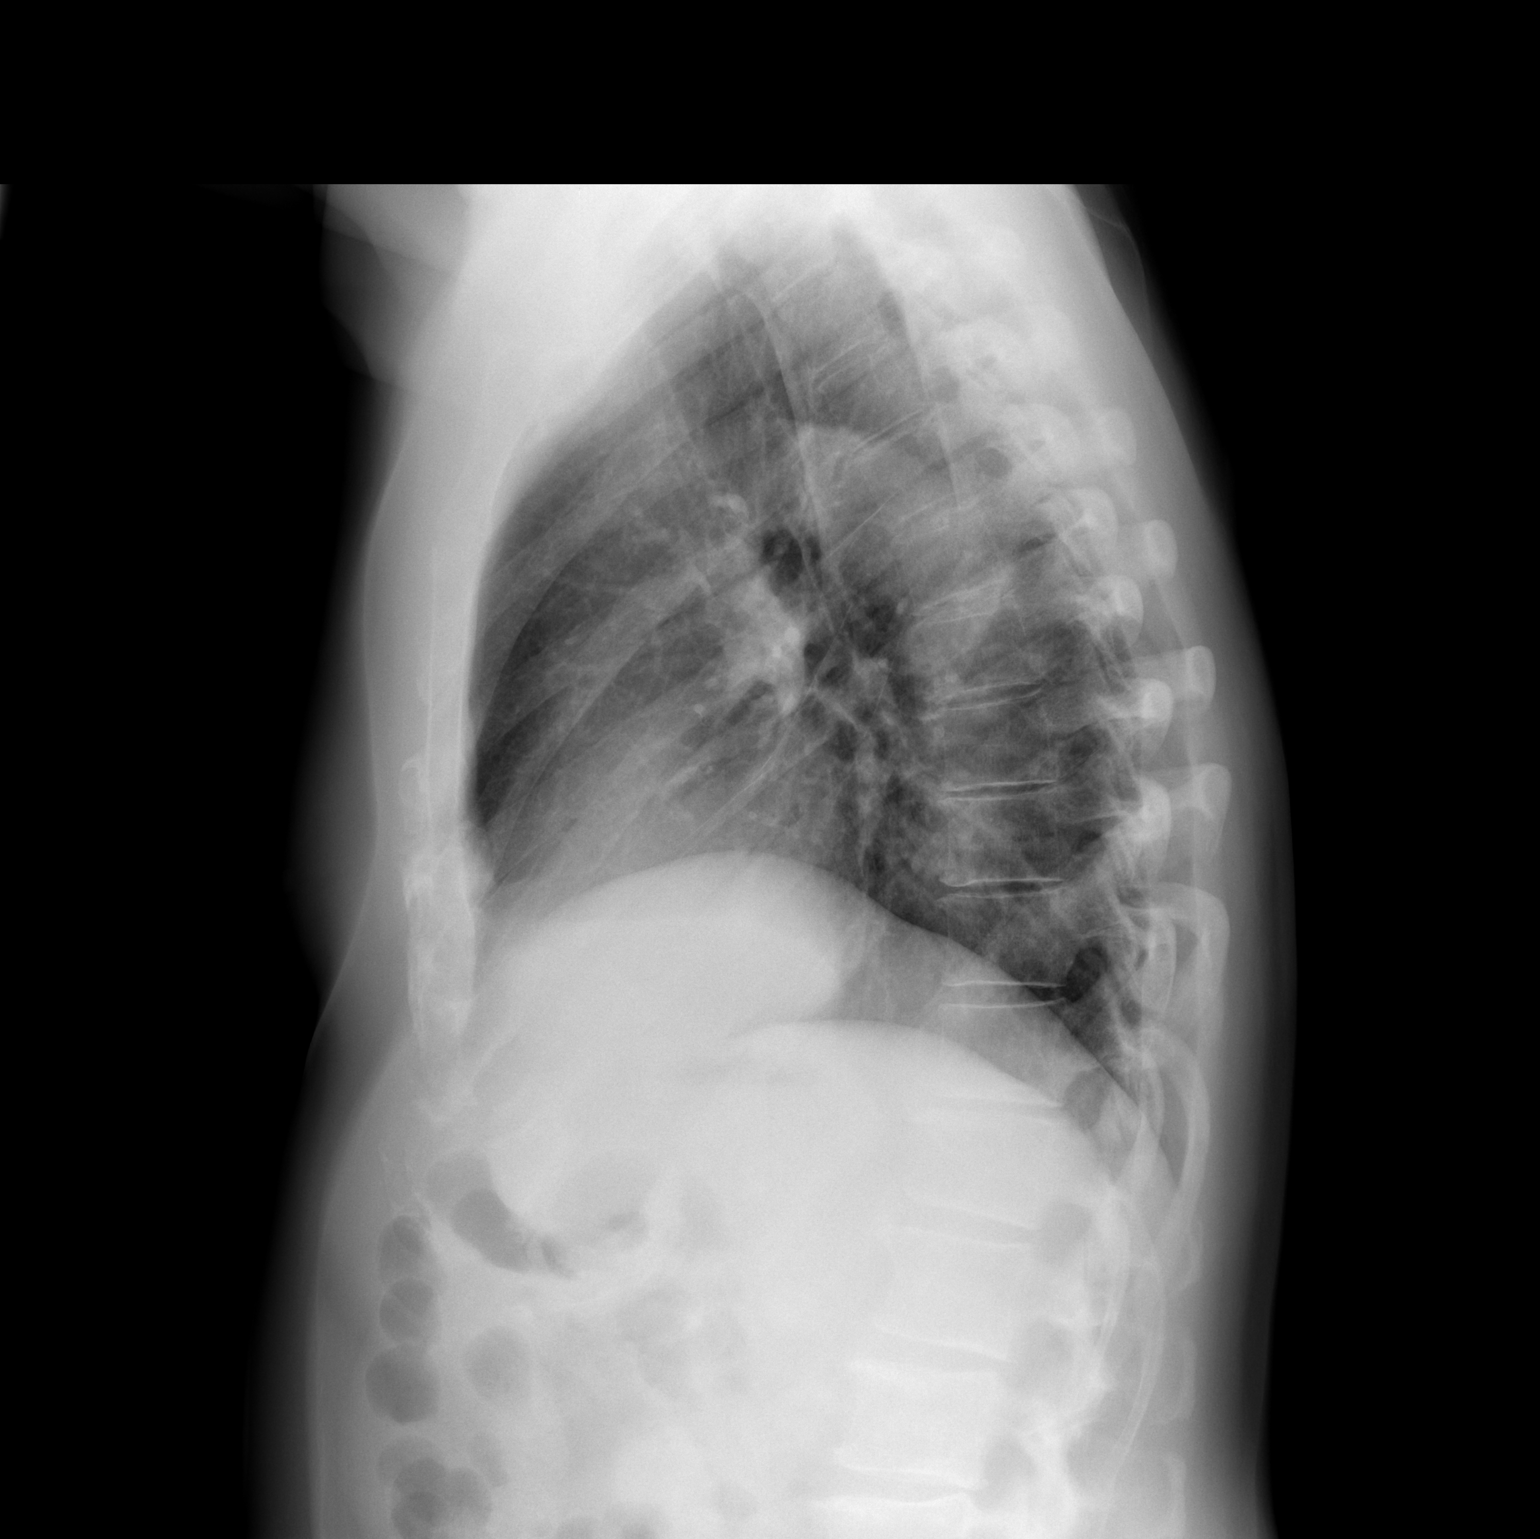

[2 of 2 positions shown; findings below may reference images not displayed]

FINDINGS: The heart size and mediastinal contours are within normal limits.
Both lungs are clear. The visualized skeletal structures show mild
typical degenerative changes of thoracic spine. No acute or
suspicious bony abnormality.
IMPRESSION: No active cardiopulmonary disease.
